# Patient Record
Sex: Female | Born: 1980 | Race: White | Hispanic: No | Marital: Married | Smoking: Never smoker
Health system: Southern US, Community
[De-identification: ages and names within clinical notes are randomized; demographics above are authoritative.]

---

## 2013-02-02 ENCOUNTER — Ambulatory Visit (INDEPENDENT_AMBULATORY_CARE_PROVIDER_SITE_OTHER): Payer: BC Managed Care – PPO | Admitting: Podiatry

## 2013-02-02 ENCOUNTER — Encounter: Payer: Self-pay | Admitting: Podiatry

## 2013-02-02 VITALS — BP 111/66 | HR 60 | Resp 16 | Ht 65.0 in | Wt 117.0 lb

## 2013-02-02 DIAGNOSIS — L03039 Cellulitis of unspecified toe: Secondary | ICD-10-CM

## 2013-02-02 DIAGNOSIS — L02619 Cutaneous abscess of unspecified foot: Secondary | ICD-10-CM | POA: Insufficient documentation

## 2013-02-02 MED ORDER — FLUCONAZOLE 150 MG PO TABS: 150.0000 mg | ORAL_TABLET | Freq: Once | ORAL | Status: DC

## 2013-02-02 MED ORDER — CEPHALEXIN 500 MG PO CAPS: 500.0000 mg | ORAL_CAPSULE | Freq: Three times a day (TID) | ORAL | Status: DC

## 2013-02-02 NOTE — Progress Notes (Signed)
N bleeding  L left great at base of toenail D end of aug  O all of sudden C worse  A T neosporin

## 2013-02-02 NOTE — Progress Notes (Signed)
Desiree Harris presents today with a chief complaint of a painful hallux left. She states they have an area behind my toenail that bleeds and is painful. She states this started in August. She's been soaking in an apply Neosporin. She's had a problem with this toe in the past. She has not been on about a cutis. Denies any direct trauma.  Objective: I have reviewed her past mental history medications allergies. Vital signs are stable she is alert and oriented x3. Pulses are palpable and strong and equal bilateral. Neurologic sensorium is intact bilateral. Deep tendon reflexes are intact bilateral. Muscle strength is strong and equal. Cutaneous evaluation demonstrates supple while hydrated cutis no erythema edema cellulitis drainage or odor with exception of the proximal nail fold hallux left. There is granulation tissue in friable tissue at the proximal nail fold it appears that the portion of the nail plate has been broken down or removed.  Assessment: Paronychia abscess proximal nail fold hallux left  Plan: Total nail avulsion with incision and drainage of the abscess. This was performed after 3 cc of a 50-50 mixture of Marcaine plain lidocaine plain was infiltrated in a hallux block left. Betadine scrub was used to prep the toe and an Esmarch bandage was wrapped around the toe. A rubber band was used for a tourniquet. The nail plate was removed in total the abscess was resected all necrotic tissue was removed. The nailbed and the matrix looks good [place] signs of infection once it was cleaned out. There was no probable bone. She was given both oral and written home-going instructions for the care of this toe. She will start soaking Betadine water tomorrow. She was also written 2 prescriptions; Keflex and Diflucan. Followup with her in one week.

## 2013-02-02 NOTE — Patient Instructions (Signed)

## 2013-02-09 ENCOUNTER — Ambulatory Visit (INDEPENDENT_AMBULATORY_CARE_PROVIDER_SITE_OTHER): Payer: BC Managed Care – PPO | Admitting: Podiatry

## 2013-02-09 ENCOUNTER — Encounter: Payer: Self-pay | Admitting: Podiatry

## 2013-02-09 VITALS — BP 102/63 | HR 61 | Resp 16 | Ht 65.0 in | Wt 116.0 lb

## 2013-02-09 DIAGNOSIS — L02619 Cutaneous abscess of unspecified foot: Secondary | ICD-10-CM

## 2013-02-09 NOTE — Progress Notes (Signed)
Desiree Harris presents today with her little girl. She's here to followup for a total nail avulsion hallux left. She states it doesn't feel great. She's been soaking in the Betadine water on a twice a day basis and covering the nail.  Objective: Vital signs are stable she is alert and oriented x3. Pulses are palpable left lower extremity. Hallux nail bed demonstrates nice granulation tissue with areas of epithelialization. Signs of infection.  Assessment: Well-healing surgical toe hallux left.  Plan: Discontinue Betadine and water soaks start with Epsom salts and water soaks on a twice a day basis. Apply a small amount of Aquaphor to the wound. Cover with a Band-Aid during the day and leave it uncovered at night. Followup with [place] as needed.

## 2013-08-18 ENCOUNTER — Ambulatory Visit: Payer: BC Managed Care – PPO | Admitting: Podiatry

## 2013-08-22 ENCOUNTER — Ambulatory Visit (INDEPENDENT_AMBULATORY_CARE_PROVIDER_SITE_OTHER): Payer: BC Managed Care – PPO | Admitting: Podiatry

## 2013-08-22 VITALS — BP 119/76 | HR 50 | Resp 16

## 2013-08-22 DIAGNOSIS — L03039 Cellulitis of unspecified toe: Secondary | ICD-10-CM

## 2013-08-22 MED ORDER — CEPHALEXIN 500 MG PO CAPS: 500.0000 mg | ORAL_CAPSULE | Freq: Two times a day (BID) | ORAL | Status: DC

## 2013-08-22 MED ORDER — FLUCONAZOLE 150 MG PO TABS: ORAL_TABLET | ORAL | Status: DC

## 2013-08-22 NOTE — Progress Notes (Signed)
She presents today for a chief complaint of what she thinks was ingrown nail or infection around her toenail hallux left. She states that it was green with purulence and now appears to be healing quite nicely since she's been soaking in Epsom salts warm water.  Objective: Vital signs are stable she is alert and oriented x3 pulses are palpable left foot. There is no erythema edema saline is drainage or odor. Some evidence of drainage is present.  Assessment: Ingrown nail paronychia hallux left.  Plan: Started her on Keflex 500 mg 1 by mouth twice a day with 3 Diflucan. And I will followup with her in one month if necessary.

## 2014-12-06 ENCOUNTER — Ambulatory Visit (INDEPENDENT_AMBULATORY_CARE_PROVIDER_SITE_OTHER): Payer: BLUE CROSS/BLUE SHIELD | Admitting: Podiatry

## 2014-12-06 ENCOUNTER — Ambulatory Visit (INDEPENDENT_AMBULATORY_CARE_PROVIDER_SITE_OTHER): Payer: BLUE CROSS/BLUE SHIELD

## 2014-12-06 ENCOUNTER — Encounter: Payer: Self-pay | Admitting: Podiatry

## 2014-12-06 VITALS — BP 88/60 | HR 75 | Resp 18

## 2014-12-06 DIAGNOSIS — M67471 Ganglion, right ankle and foot: Secondary | ICD-10-CM

## 2014-12-06 DIAGNOSIS — R52 Pain, unspecified: Secondary | ICD-10-CM

## 2014-12-06 NOTE — Progress Notes (Signed)
   Subjective:    Patient ID: Desiree Harris, female    DOB: 1981-03-23, 34 y.o.   MRN: [id number]  HPI I HAVE A LUMP ON MY RIGHT BIG TOE AND IT HAS BEEN 2 MONTHS AND NO BURNING, THROBBING AND IS RED AND DOES NOT HURT TO BEND she does not relate any trauma and states that it hurt initially occurred.  Review of Systems  All other systems reviewed and are negative.      Objective:   Physical Exam: 34 year old white female vital signs are stable no acute distress presents strong palpable pulses DP and PT bilateral. Capillary fill time to digits 1 through 5 is immediate. Neurologic sensorium is intact for Semmes-Weinstein monofilament. Deep tendon reflexes are intact bilateral and muscle strength is 5 over 5 wrist flexors and flexors and inverters and everters all of his musculature is intact. Orthopedic evaluation of his rates also assisted to the ankle for range of motion without crepitation. Cutaneous evaluation demonstrates supple well-hydrated cutis 1 x 1 cm mucoid-type cyst just proximal to the medial aspect of the first metatarsophalangeal joint of the right foot. Radiographs do not demonstrate any calcifications were taken today 3 views right foot demonstrates normal osseous architecture. Skin marker suggestive of ganglion cyst.      Assessment & Plan:  Assessment: Ganglion cyst medial aspect first metatarsophalangeal joint right foot.  Plan: We discussed the etiology pathology conservative versus surgical therapies. At this point she states it is not hurting her and she doesn't want to do anything about this. I did advise her that as this continues to grow over time more than likely will start to intrude into the nervous tissue and cause symptoms. Should this happen she suffice immediately for MRI and excision.

## 2018-03-15 ENCOUNTER — Ambulatory Visit: Payer: BLUE CROSS/BLUE SHIELD | Admitting: Podiatry

## 2018-03-15 ENCOUNTER — Encounter: Payer: Self-pay | Admitting: Podiatry

## 2018-03-15 DIAGNOSIS — L603 Nail dystrophy: Secondary | ICD-10-CM | POA: Diagnosis not present

## 2018-03-15 NOTE — Patient Instructions (Signed)

## 2018-03-16 NOTE — Progress Notes (Signed)
She presents after having not seen her for the past 3 years with a chief complaint of pain to the hallux nail plate left.  She states that after we remove the nail several years ago due to the proximal nail fold infection that she had this is how it grew back and is not very pretty and wants to know if there is anything at all she can do to save the nail or to do to make it not look so ugly.  Objective: Vital signs are stable she is alert and oriented x3.  Pulses are palpable.  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  It is tender on palpation as well as debridement.  Assessment: Nail dystrophy most likely a total nail dystrophy probably not associated with a organism.  Plan: Samples of the nail were taken today for pathologic evaluation just to rule out any type of fungal component.  We did discuss the possible complete removal of total matrixectomy of this hallux left she understands and is amenable to it and I will follow-up with her in approximately 1 month or so.  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

## 2018-04-07 ENCOUNTER — Ambulatory Visit: Payer: BLUE CROSS/BLUE SHIELD | Admitting: Podiatry

## 2018-04-07 ENCOUNTER — Encounter: Payer: Self-pay | Admitting: Podiatry

## 2018-04-07 DIAGNOSIS — L603 Nail dystrophy: Secondary | ICD-10-CM | POA: Diagnosis not present

## 2018-04-07 MED ORDER — FLUCONAZOLE 150 MG PO TABS: 300.0000 mg | ORAL_TABLET | ORAL | 0 refills | Status: AC

## 2018-04-07 NOTE — Progress Notes (Signed)
She presents today for follow-up of her nail dystrophy.  She states that it is unchanged.  Objective: Pathology report does demonstrate saprophytic fungus as well as a yeast infection.  Assessment: Yeast infection saprophytic fungus onychomycosis.  Plan: Started her on 300 mg of Diflucan once a week for the next 4 months at least to see if were going to have any improvement if there is no improvement during that time then we may consider removing it.

## 2018-08-09 ENCOUNTER — Ambulatory Visit: Payer: BLUE CROSS/BLUE SHIELD | Admitting: Podiatry

## 2018-09-08 ENCOUNTER — Ambulatory Visit: Payer: BLUE CROSS/BLUE SHIELD | Admitting: Podiatry

## 2018-09-22 ENCOUNTER — Ambulatory Visit: Payer: BLUE CROSS/BLUE SHIELD | Admitting: Podiatry

## 2018-09-22 ENCOUNTER — Encounter: Payer: Self-pay | Admitting: Podiatry

## 2018-09-22 ENCOUNTER — Other Ambulatory Visit: Payer: Self-pay

## 2018-09-22 VITALS — Temp 98.5°F

## 2018-09-22 DIAGNOSIS — L600 Ingrowing nail: Secondary | ICD-10-CM

## 2018-09-22 MED ORDER — NEOMYCIN-POLYMYXIN-HC 1 % OT SOLN: OTIC | 1 refills | Status: AC

## 2018-09-22 NOTE — Patient Instructions (Signed)

## 2018-09-22 NOTE — Progress Notes (Signed)
She presents today states that the antifungal that were using currently the Diflucan has not helped at all she does like to have the toenail removed permanently as she refers to hallux left.  She states that is painful would like to have it removed.  Objective: Vital signs are stable she is alert and oriented x3 there is no erythema edema cellulitis drainage odor painful hallux nail left.  Assessment: Painful dystrophic mycotic hallux nail left.  Plan: After local anesthetic was administered total complete nail avulsion was performed with matrixectomy.  She tolerated procedure well.  She was provided with both oral and written home-going instructions for care and soaking of the toe.  He understands this is amenable to it.  She also received a prescription for Cortisporin Otic to be applied twice daily after soaking.  We discussed the signs and symptoms of infection and she will notify me if there are any.  Otherwise I will like to follow-up with her in 2 weeks at which time we will reevaluate her healing.

## 2018-10-06 ENCOUNTER — Encounter: Payer: Self-pay | Admitting: Podiatry

## 2018-10-06 ENCOUNTER — Ambulatory Visit: Payer: BLUE CROSS/BLUE SHIELD | Admitting: Podiatry

## 2018-10-06 ENCOUNTER — Other Ambulatory Visit: Payer: Self-pay

## 2018-10-06 VITALS — Temp 97.4°F

## 2018-10-06 DIAGNOSIS — Z9889 Other specified postprocedural states: Secondary | ICD-10-CM

## 2018-10-06 DIAGNOSIS — L600 Ingrowing nail: Secondary | ICD-10-CM

## 2018-10-06 NOTE — Patient Instructions (Signed)

## 2018-10-06 NOTE — Progress Notes (Signed)
She presents today for follow-up of her matrixectomy hallux left.  She denies fever chills nausea vomiting muscle aches pains calf pain back pain chest pain shortness of breath.  Objective: Vital signs are stable she is alert and oriented x3.  Hallux left demonstrates well-healing matrixectomy no erythema edema cellulitis drainage or odor.  Assessment: Well-healing surgical toe hallux left.  Plan: Encouraged her to soak it for the next 2 to 3 weeks every other day cover during the day but leave open at nighttime.  We will follow-up with her only if this worsens and becomes more painful.  I will follow-up with her at that time.

## 2019-02-02 ENCOUNTER — Ambulatory Visit: Payer: BC Managed Care – PPO

## 2019-02-02 ENCOUNTER — Other Ambulatory Visit: Payer: Self-pay

## 2019-02-02 DIAGNOSIS — Z23 Encounter for immunization: Secondary | ICD-10-CM

## 2020-10-04 ENCOUNTER — Other Ambulatory Visit: Payer: Self-pay | Admitting: Family Medicine

## 2020-10-04 DIAGNOSIS — Z1231 Encounter for screening mammogram for malignant neoplasm of breast: Secondary | ICD-10-CM

## 2020-10-23 ENCOUNTER — Ambulatory Visit
Admission: RE | Admit: 2020-10-23 | Discharge: 2020-10-23 | Disposition: A | Discharge status: Home / Self Care | Payer: BC Managed Care – PPO | Source: Ambulatory Visit | Attending: Family Medicine | Admitting: Family Medicine

## 2020-10-23 ENCOUNTER — Other Ambulatory Visit: Payer: Self-pay

## 2020-10-23 DIAGNOSIS — Z1231 Encounter for screening mammogram for malignant neoplasm of breast: Secondary | ICD-10-CM | POA: Diagnosis not present

## 2020-10-30 ENCOUNTER — Other Ambulatory Visit: Payer: Self-pay | Admitting: Family Medicine

## 2020-10-30 DIAGNOSIS — N6310 Unspecified lump in the right breast, unspecified quadrant: Secondary | ICD-10-CM

## 2020-10-30 DIAGNOSIS — R928 Other abnormal and inconclusive findings on diagnostic imaging of breast: Secondary | ICD-10-CM

## 2020-11-05 ENCOUNTER — Other Ambulatory Visit: Payer: Self-pay

## 2020-11-05 ENCOUNTER — Ambulatory Visit
Admission: RE | Admit: 2020-11-05 | Discharge: 2020-11-05 | Disposition: A | Discharge status: Home / Self Care | Payer: BC Managed Care – PPO | Source: Ambulatory Visit | Attending: Family Medicine | Admitting: Family Medicine

## 2020-11-05 DIAGNOSIS — R928 Other abnormal and inconclusive findings on diagnostic imaging of breast: Secondary | ICD-10-CM | POA: Diagnosis present

## 2020-11-05 DIAGNOSIS — N6310 Unspecified lump in the right breast, unspecified quadrant: Secondary | ICD-10-CM | POA: Insufficient documentation

## 2021-10-29 ENCOUNTER — Other Ambulatory Visit: Payer: Self-pay | Admitting: Family Medicine

## 2021-10-29 DIAGNOSIS — Z1231 Encounter for screening mammogram for malignant neoplasm of breast: Secondary | ICD-10-CM

## 2021-11-26 ENCOUNTER — Ambulatory Visit
Admission: RE | Admit: 2021-11-26 | Discharge: 2021-11-26 | Disposition: A | Discharge status: Home / Self Care | Payer: BC Managed Care – PPO | Source: Ambulatory Visit | Attending: Family Medicine | Admitting: Family Medicine

## 2021-11-26 DIAGNOSIS — Z1231 Encounter for screening mammogram for malignant neoplasm of breast: Secondary | ICD-10-CM | POA: Insufficient documentation

## 2021-11-28 ENCOUNTER — Other Ambulatory Visit: Payer: Self-pay | Admitting: Family Medicine

## 2021-11-28 DIAGNOSIS — R928 Other abnormal and inconclusive findings on diagnostic imaging of breast: Secondary | ICD-10-CM

## 2021-11-28 DIAGNOSIS — N63 Unspecified lump in unspecified breast: Secondary | ICD-10-CM

## 2021-12-18 ENCOUNTER — Ambulatory Visit
Admission: RE | Admit: 2021-12-18 | Discharge: 2021-12-18 | Disposition: A | Discharge status: Home / Self Care | Payer: BC Managed Care – PPO | Source: Ambulatory Visit | Attending: Family Medicine | Admitting: Family Medicine

## 2021-12-18 DIAGNOSIS — R928 Other abnormal and inconclusive findings on diagnostic imaging of breast: Secondary | ICD-10-CM | POA: Insufficient documentation

## 2021-12-18 DIAGNOSIS — N63 Unspecified lump in unspecified breast: Secondary | ICD-10-CM

## 2022-03-31 IMAGING — MG MM DIGITAL SCREENING BILAT W/ TOMO AND CAD
8 series · 9 of 24 positions shown · non-contrast
Comparison: None.

CLINICAL DATA: Screening.

EXAM:
DIGITAL SCREENING BILATERAL MAMMOGRAM WITH TOMOSYNTHESIS AND CAD
TECHNIQUE: Bilateral screening digital craniocaudal and mediolateral oblique
mammograms were obtained. Bilateral screening digital breast
tomosynthesis was performed. The images were evaluated with
computer-aided detection.

[R MLO synth-2D]
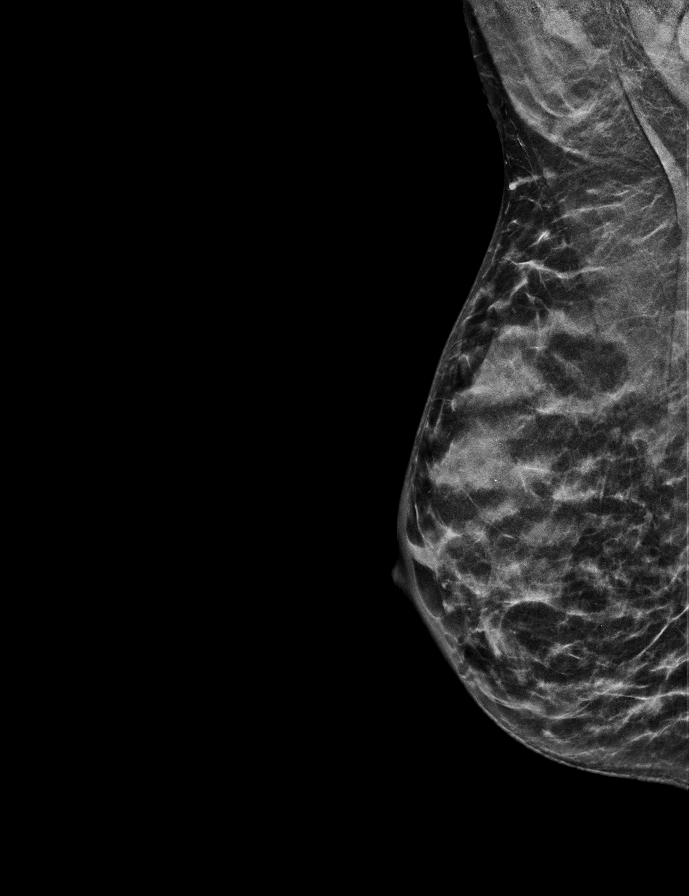

[R CC synth-2D]
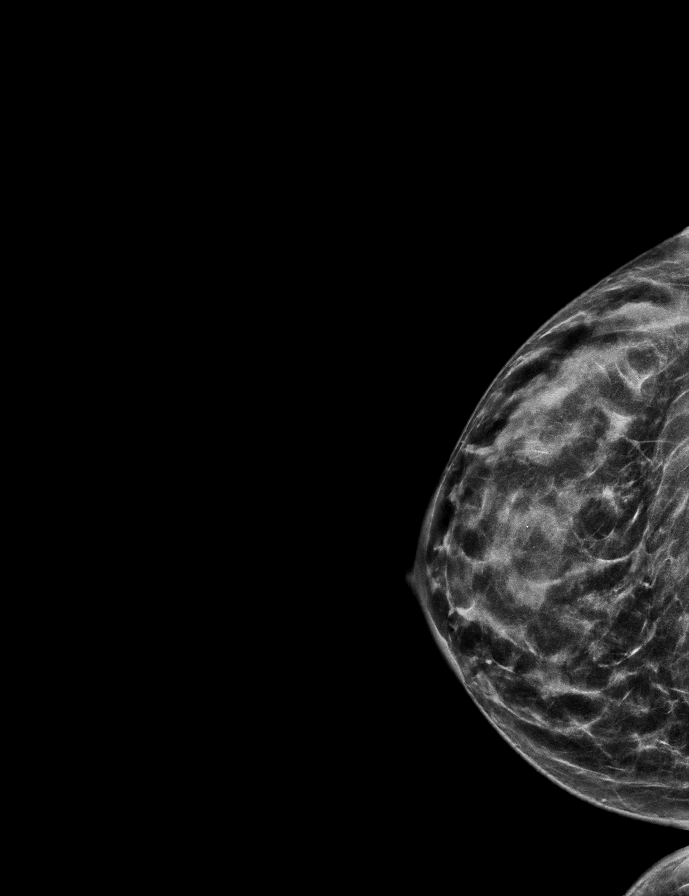

[L MLO synth-2D]
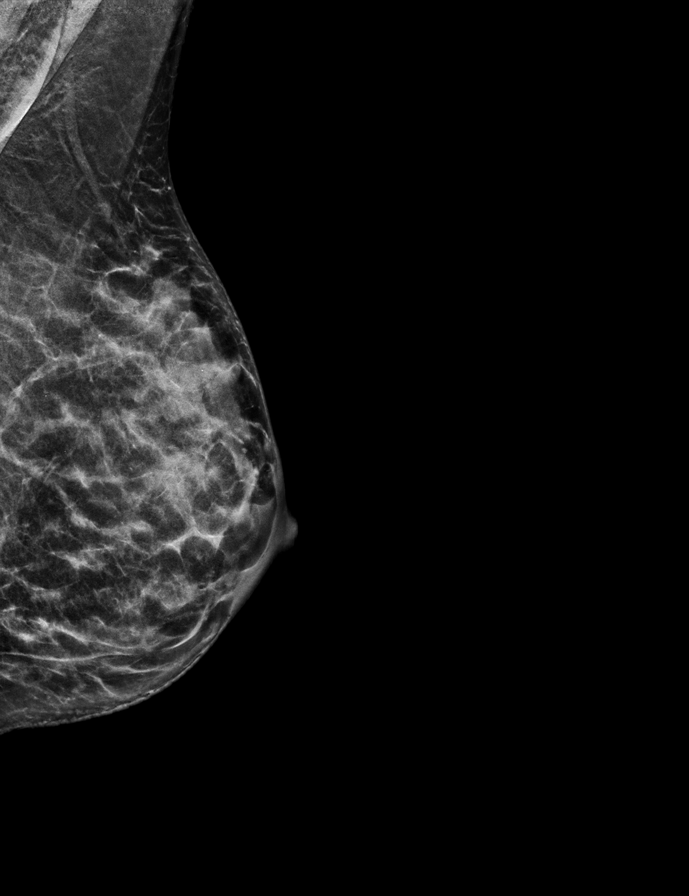

[L CC synth-2D]
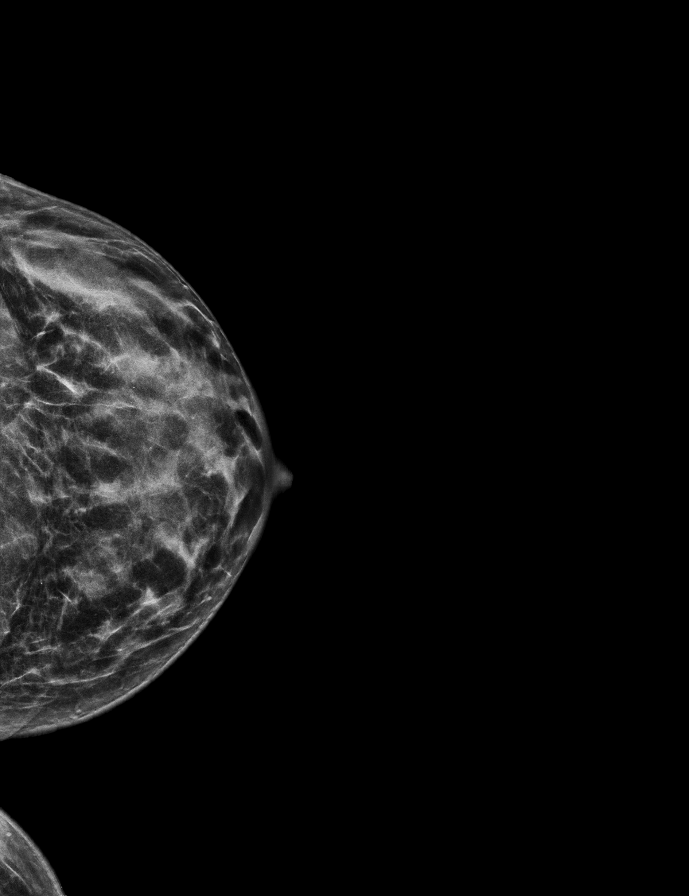

[L MLO tomo · 2 of 47 frames shown]
[frame 16/47]
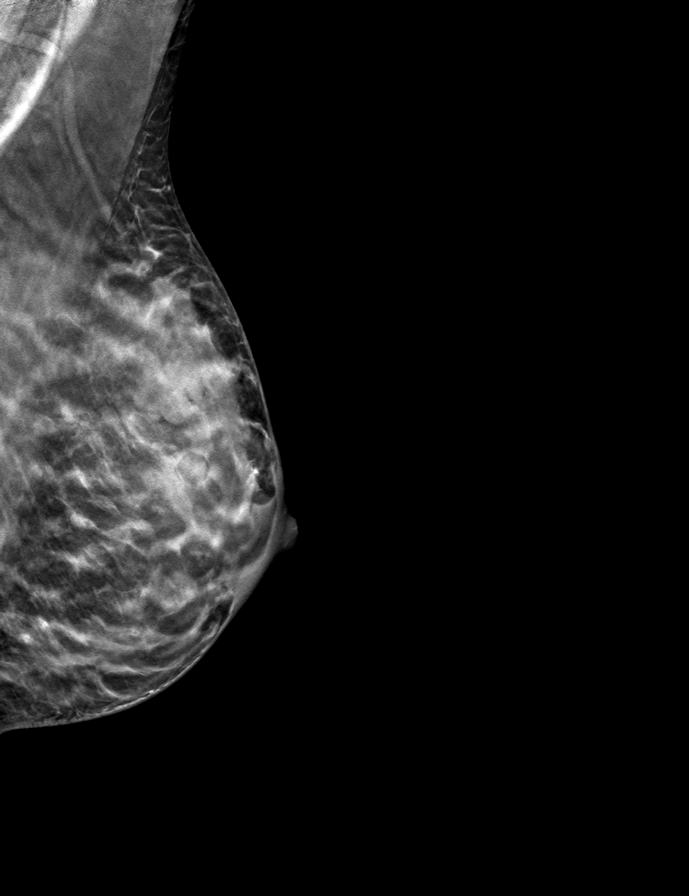
[frame 24/47]
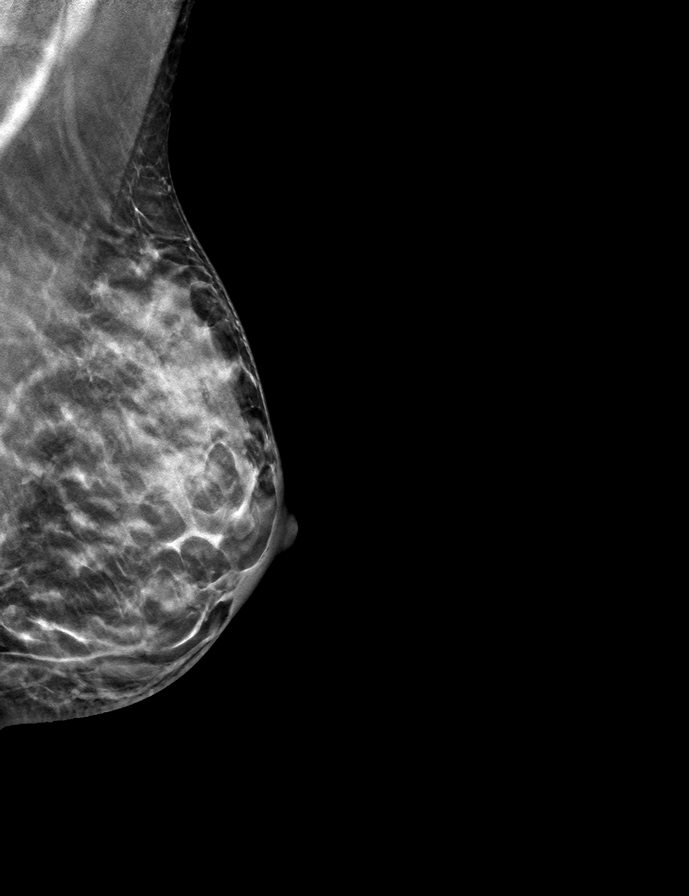

[L CC tomo · tomo slice 25/48.0]
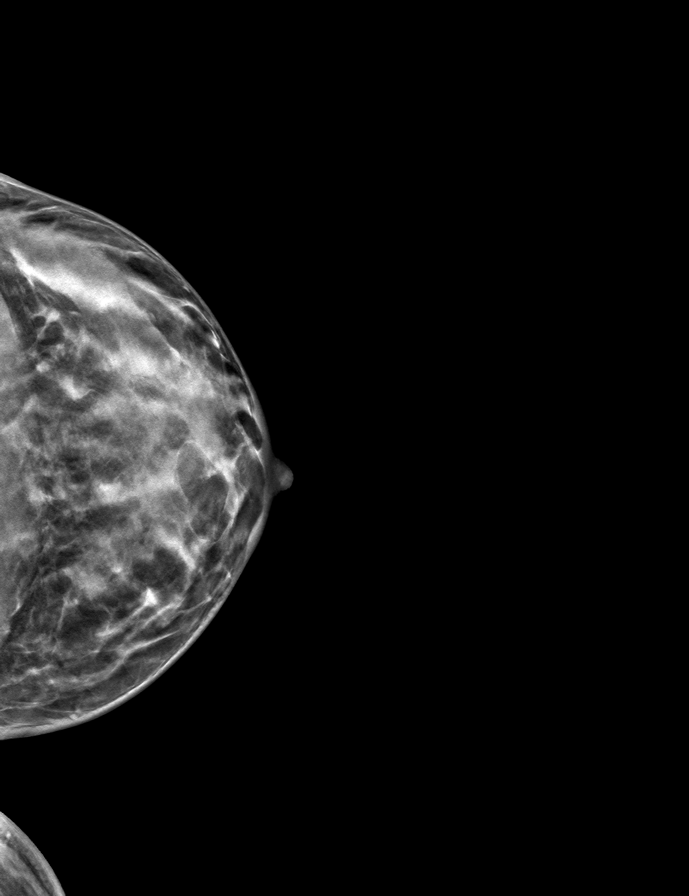

[R MLO tomo · tomo slice 23/46.0]
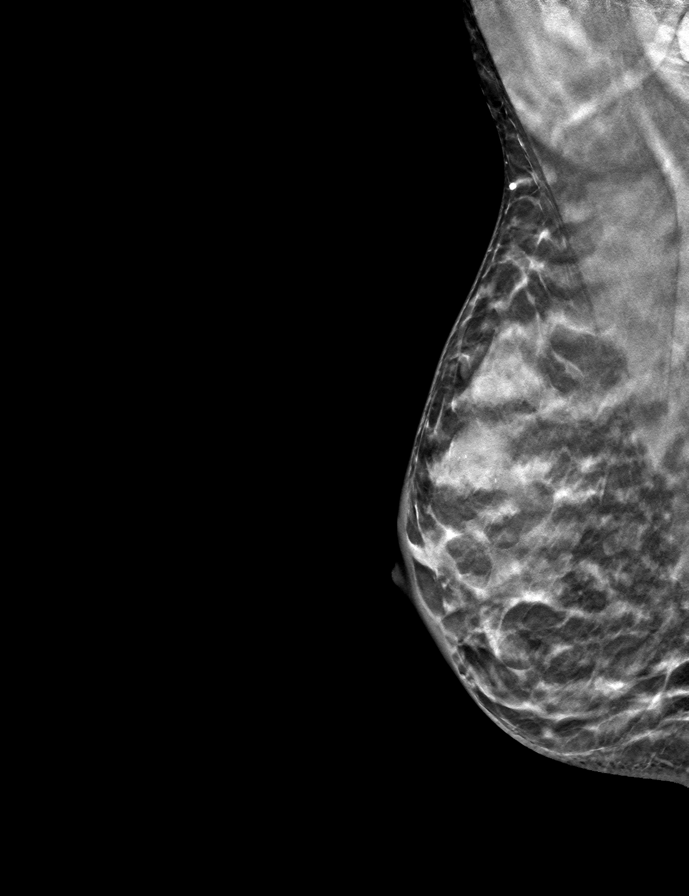

[R CC tomo · tomo slice 27/52.0]
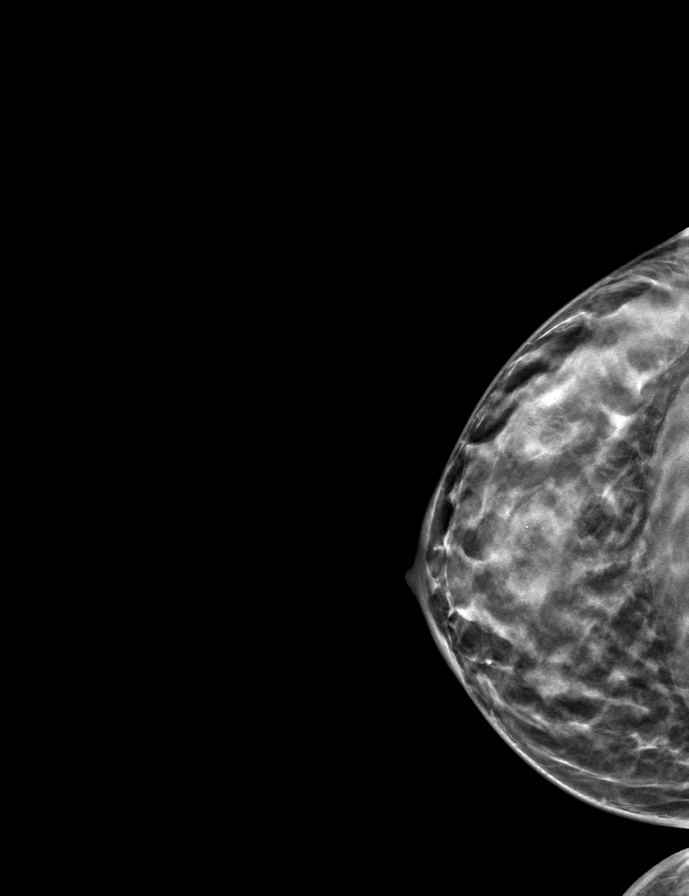

[9 of 24 positions shown; findings below may reference images not displayed]

ACR Breast Density Category c: The breast tissue is heterogeneously
dense, which may obscure small masses.
FINDINGS: In the right breast, a possible mass warrants further evaluation. In
the left breast, no findings suspicious for malignancy.
IMPRESSION: Further evaluation is suggested for a possible mass in the right
breast.

RECOMMENDATION:
Diagnostic mammogram and possibly ultrasound of the right breast.
(Code:VQ-7-YY8)

The patient will be contacted regarding the findings, and additional
imaging will be scheduled.

BI-RADS CATEGORY  0: Incomplete. Need additional imaging evaluation
and/or prior mammograms for comparison.

## 2022-11-04 ENCOUNTER — Other Ambulatory Visit: Payer: Self-pay | Admitting: Family Medicine

## 2022-11-04 DIAGNOSIS — Z1231 Encounter for screening mammogram for malignant neoplasm of breast: Secondary | ICD-10-CM

## 2022-12-05 ENCOUNTER — Ambulatory Visit
Admission: RE | Admit: 2022-12-05 | Discharge: 2022-12-05 | Disposition: A | Discharge status: Home / Self Care | Payer: BC Managed Care – PPO | Source: Ambulatory Visit | Attending: Family Medicine | Admitting: Family Medicine

## 2022-12-05 DIAGNOSIS — Z1231 Encounter for screening mammogram for malignant neoplasm of breast: Secondary | ICD-10-CM | POA: Diagnosis not present

## 2023-10-22 ENCOUNTER — Other Ambulatory Visit: Payer: Self-pay | Admitting: Family Medicine

## 2023-10-22 DIAGNOSIS — Z1231 Encounter for screening mammogram for malignant neoplasm of breast: Secondary | ICD-10-CM

## 2023-12-11 ENCOUNTER — Ambulatory Visit
Admission: RE | Admit: 2023-12-11 | Discharge: 2023-12-11 | Disposition: A | Discharge status: Home / Self Care | Source: Ambulatory Visit | Attending: Family Medicine | Admitting: Family Medicine

## 2023-12-11 DIAGNOSIS — Z1231 Encounter for screening mammogram for malignant neoplasm of breast: Secondary | ICD-10-CM | POA: Insufficient documentation
